# Patient Record
Sex: Male | Born: 1995 | Race: Black or African American | Hispanic: No | Marital: Single | Smoking: Never smoker
Health system: Southern US, Community
[De-identification: ages and names within clinical notes are randomized; demographics above are authoritative.]

---
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
## 2019-01-10 ENCOUNTER — Emergency Department (HOSPITAL_COMMUNITY)
Admission: EM | Admit: 2019-01-10 | Discharge: 2019-01-10 | Disposition: A | Discharge status: Home / Self Care | Payer: Self-pay | Attending: Emergency Medicine | Admitting: Emergency Medicine

## 2019-01-10 ENCOUNTER — Encounter (HOSPITAL_COMMUNITY): Payer: Self-pay | Admitting: Family Medicine

## 2019-01-10 ENCOUNTER — Other Ambulatory Visit: Payer: Self-pay

## 2019-01-10 DIAGNOSIS — L02414 Cutaneous abscess of left upper limb: Secondary | ICD-10-CM | POA: Insufficient documentation

## 2019-01-10 DIAGNOSIS — L0291 Cutaneous abscess, unspecified: Secondary | ICD-10-CM

## 2019-01-10 MED ORDER — DOXYCYCLINE HYCLATE 100 MG PO CAPS: 100.0000 mg | ORAL_CAPSULE | Freq: Two times a day (BID) | ORAL | 0 refills | Status: AC

## 2019-01-10 MED ORDER — LIDOCAINE-EPINEPHRINE (PF) 2 %-1:200000 IJ SOLN
10.0000 mL | Freq: Once | INTRAMUSCULAR | Status: AC
  Administered 2019-01-10: 10 mL
  Filled 2019-01-10: qty 10

## 2019-01-10 NOTE — ED Triage Notes (Signed)
Patient has a boil to his left lateral forearm that started about 2 days ago. Patient states he has drained pus from the center. Patient has applied unknown ointment and covered with band-aid. Denies fever.

## 2019-01-10 NOTE — ED Notes (Signed)
ED Provider at bedside. 

## 2019-01-10 NOTE — Discharge Instructions (Signed)
Take the antibiotics as prescribed, continue to apply warm compresses return as needed for worsening symptoms

## 2019-01-10 NOTE — ED Provider Notes (Signed)
[facility]-EMERGENCY DEPT Provider Note   CSN: [id number] Arrival date & time: 01/10/19  1234    History   Chief Complaint Chief Complaint  Patient presents with  . Recurrent Skin Infections    HPI Brandon Lutz is a 23 y.o. male.     HPI Patient presents to the emergency room for evaluation of swelling and tenderness in an area of his left distal forearm.  Patient states he noticed a small pimple-like area couple days ago.  He tried to drain the pus out of it today.  He started noticing increasing redness and swelling.  He also started feeling some discomfort and numbness going up towards his forearm.  He denies any fevers or chills.  No vomiting or diarrhea. History reviewed. No pertinent past medical history.  There are no active problems to display for this patient.   Past Surgical History:  Procedure Laterality Date  . FINGER SURGERY          Home Medications    Prior to Admission medications   Medication Sig Start Date End Date Taking? Authorizing Provider  doxycycline (VIBRAMYCIN) 100 MG capsule Take 1 capsule (100 mg total) by mouth 2 (two) times daily. 01/10/19   Linwood Dibbles, MD    Family History History reviewed. No pertinent family history.  Social History Social History   Tobacco Use  . Smoking status: Never Smoker  . Smokeless tobacco: Never Used  Substance Use Topics  . Alcohol use: Yes    Frequency: Never  . Drug use: Yes    Types: Marijuana    Comment: once every 3 weeks      Allergies   Patient has no known allergies.   Review of Systems Review of Systems  All other systems reviewed and are negative.    Physical Exam Updated Vital Signs BP (!) 115/99 (BP Location: Left Arm)   Pulse 81   Temp 98.2 F (36.8 C) (Oral)   Resp 15   Ht 1.753 m (5\' 9" )   Wt 68 kg   SpO2 99%   BMI 22.15 kg/m   Physical Exam Vitals signs and nursing note reviewed.  Constitutional:      General: He is not in acute distress.     Appearance: He is well-developed.  HENT:     Head: Normocephalic and atraumatic.     Right Ear: External ear normal.     Left Ear: External ear normal.  Eyes:     General: No scleral icterus.       Right eye: No discharge.        Left eye: No discharge.     Conjunctiva/sclera: Conjunctivae normal.  Neck:     Musculoskeletal: Neck supple.     Trachea: No tracheal deviation.  Cardiovascular:     Rate and Rhythm: Normal rate.  Pulmonary:     Effort: Pulmonary effort is normal. No respiratory distress.     Breath sounds: No stridor.  Abdominal:     General: There is no distension.  Musculoskeletal:        General: Swelling and tenderness present. No deformity.     Comments: Small approximate 1 cm area of erythema and induration with some fluctuance at the skin surface of the left distal forearm, no lymphangitic streaking, normal sensation and perfusion  Skin:    General: Skin is warm and dry.     Findings: No rash.  Neurological:     Mental Status: He is alert.  Cranial Nerves: Cranial nerve deficit: no gross deficits.      ED Treatments / Results  Labs (all labs ordered are listed, but only abnormal results are displayed) Labs Reviewed - No data to display  EKG None  Radiology No results found.  Procedures .Marland Kitchen.Incision and Drainage Date/Time: 01/10/2019 4:06 PM Performed by: Linwood DibblesKnapp, Castulo Scarpelli, MD Authorized by: Linwood DibblesKnapp, Topher Buenaventura, MD   Consent:    Consent obtained:  Verbal   Consent given by:  Patient   Risks discussed:  Bleeding, incomplete drainage, pain and damage to other organs   Alternatives discussed:  No treatment Universal protocol:    Procedure explained and questions answered to patient or proxy's satisfaction: yes     Relevant documents present and verified: yes     Test results available and properly labeled: yes     Imaging studies available: yes     Required blood products, implants, devices, and special equipment available: yes     Site/side marked: yes      Immediately prior to procedure a time out was called: yes     Patient identity confirmed:  Verbally with patient Location:    Type:  Abscess Pre-procedure details:    Skin preparation:  Betadine Anesthesia (see MAR for exact dosages):    Anesthesia method:  Local infiltration   Local anesthetic:  Lidocaine 1% WITH epi Procedure type:    Complexity:  Simple Procedure details:    Incision types:  Single straight   Incision depth:  Subcutaneous   Scalpel blade:  11   Wound management:  Irrigated with saline, extensive cleaning and probed and deloculated   Drainage:  Purulent   Drainage amount:  Scant   Packing materials:  1/4 in gauze Post-procedure details:    Patient tolerance of procedure:  Tolerated well, no immediate complications   (including critical care time)  Medications Ordered in ED Medications  lidocaine-EPINEPHrine (XYLOCAINE W/EPI) 2 %-1:200000 (PF) injection 10 mL (10 mLs Infiltration Given by Other 01/10/19 1423)     Initial Impression / Assessment and Plan / ED Course  I have reviewed the triage vital signs and the nursing notes.  Pertinent labs & imaging results that were available during my care of the patient were reviewed by me and considered in my medical decision making (see chart for details).      Patient had a small abscess on exam that was amenable to incision and drainage.  No signs of systemic infection.  Plan on discharge home with antibiotics.  Final Clinical Impressions(s) / ED Diagnoses   Final diagnoses:  Abscess    ED Discharge Orders         Ordered    doxycycline (VIBRAMYCIN) 100 MG capsule  2 times daily     01/10/19 1605           Linwood DibblesKnapp, Miriya Cloer, MD 01/10/19 [phone]
